# Patient Record
Sex: Female | Born: 1990 | Race: Black or African American | Hispanic: No | Marital: Single | Smoking: Never smoker
Health system: Southern US, Community
[De-identification: ages and names within clinical notes are randomized; demographics above are authoritative.]

## PROBLEM LIST (undated history)

## (undated) DIAGNOSIS — T4145XA Adverse effect of unspecified anesthetic, initial encounter: Secondary | ICD-10-CM

## (undated) DIAGNOSIS — T8859XA Other complications of anesthesia, initial encounter: Secondary | ICD-10-CM

---
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
## 2016-07-14 ENCOUNTER — Other Ambulatory Visit: Payer: Self-pay | Admitting: Podiatry

## 2016-07-15 NOTE — Patient Instructions (Addendum)
    Jennifer Fox  07/15/2016     @   Your procedure is scheduled on 07/23/2016.  Report to [facility] at 7:30 A.M.  Call this number if you have problems the morning of surgery:  [phone]   Remember:  Do not eat food or drink liquids after midnight.  Take these medicines the morning of surgery with A SIP OF WATER NONE   Do not wear jewelry, make-up or nail polish.  Do not wear lotions, powders, or perfumes, or deoderant.  Do not shave 48 hours prior to surgery.  Men may shave face and neck.  Do not bring valuables to the hospital.  [facility] is not responsible for any belongings or valuables.  Contacts, dentures or bridgework may not be worn into surgery.  Leave your suitcase in the car.  After surgery it may be brought to your room.  For patients admitted to the hospital, discharge time will be determined by your treatment team.  Patients discharged the day of surgery will not be allowed to drive home.    Please read over the following fact sheets that you were given. Surgical Site Infection Prevention and Anesthesia Post-op Instructions     PATIENT INSTRUCTIONS POST-ANESTHESIA  IMMEDIATELY FOLLOWING SURGERY:  Do not drive or operate machinery for the first twenty four hours after surgery.  Do not make any important decisions for twenty four hours after surgery or while taking narcotic pain medications or sedatives.  If you develop intractable nausea and vomiting or a severe headache please notify your doctor immediately.  FOLLOW-UP:  Please make an appointment with your surgeon as instructed. You do not need to follow up with anesthesia unless specifically instructed to do so.  WOUND CARE INSTRUCTIONS (if applicable):  Keep a dry clean dressing on the anesthesia/puncture wound site if there is drainage.  Once the wound has quit draining you may leave it open to air.  Generally you should leave the bandage intact for twenty four hours unless there is  drainage.  If the epidural site drains for more than 36-48 hours please call the anesthesia department.  QUESTIONS?:  Please feel free to call your physician or the hospital operator if you have any questions, and they will be happy to assist you.

## 2016-07-21 ENCOUNTER — Ambulatory Visit (HOSPITAL_COMMUNITY)
Admission: RE | Admit: 2016-07-21 | Discharge: 2016-07-21 | Disposition: A | Discharge status: Home / Self Care | Payer: BLUE CROSS/BLUE SHIELD | Source: Ambulatory Visit | Attending: Podiatry | Admitting: Podiatry

## 2016-07-21 ENCOUNTER — Encounter (HOSPITAL_COMMUNITY)
Admission: RE | Admit: 2016-07-21 | Discharge: 2016-07-21 | Disposition: A | Discharge status: Home / Self Care | Payer: BLUE CROSS/BLUE SHIELD | Source: Ambulatory Visit | Attending: Podiatry | Admitting: Podiatry

## 2016-07-21 ENCOUNTER — Other Ambulatory Visit (HOSPITAL_COMMUNITY): Payer: Self-pay | Admitting: Podiatry

## 2016-07-21 ENCOUNTER — Encounter (HOSPITAL_COMMUNITY): Payer: Self-pay

## 2016-07-21 DIAGNOSIS — M79671 Pain in right foot: Secondary | ICD-10-CM | POA: Insufficient documentation

## 2016-07-21 DIAGNOSIS — Z981 Arthrodesis status: Secondary | ICD-10-CM

## 2016-07-21 LAB — BASIC METABOLIC PANEL
ANION GAP: 9 (ref 5–15)
BUN: 12 mg/dL (ref 6–20)
CO2: 23 mmol/L (ref 22–32)
Calcium: 9.2 mg/dL (ref 8.9–10.3)
Chloride: 105 mmol/L (ref 101–111)
Creatinine, Ser: 0.86 mg/dL (ref 0.44–1.00)
GFR calc Af Amer: >60 mL/min (ref >60)
GFR calc non Af Amer: >60 mL/min (ref >60)
GLUCOSE: 71 mg/dL (ref 65–99)
POTASSIUM: 4.1 mmol/L (ref 3.5–5.1)
Sodium: 137 mmol/L (ref 135–145)

## 2016-07-21 LAB — CBC
HCT: 34.7 % — ABNORMAL LOW (ref 36.0–46.0)
Hemoglobin: 11.2 g/dL — ABNORMAL LOW (ref 12.0–15.0)
MCH: 26.9 pg (ref 26.0–34.0)
MCHC: 32.3 g/dL (ref 30.0–36.0)
MCV: 83.4 fL (ref 78.0–100.0)
Platelets: 239 10*3/uL (ref 150–400)
RBC: 4.16 MIL/uL (ref 3.87–5.11)
RDW: 13.4 % (ref 11.5–15.5)
WBC: 4.2 10*3/uL (ref 4.0–10.5)

## 2016-07-21 LAB — HCG, SERUM, QUALITATIVE: Preg, Serum: NEGATIVE

## 2016-07-23 ENCOUNTER — Encounter (HOSPITAL_COMMUNITY)
Admission: RE | Disposition: A | Discharge status: Home / Self Care | Payer: Self-pay | Source: Ambulatory Visit | Attending: Podiatry

## 2016-07-23 ENCOUNTER — Ambulatory Visit (HOSPITAL_COMMUNITY)
Admission: RE | Admit: 2016-07-23 | Discharge: 2016-07-23 | Disposition: A | Discharge status: Home / Self Care | Payer: BLUE CROSS/BLUE SHIELD | Source: Ambulatory Visit | Attending: Podiatry | Admitting: Podiatry

## 2016-07-23 ENCOUNTER — Other Ambulatory Visit: Payer: Self-pay | Admitting: Podiatry

## 2016-07-23 ENCOUNTER — Ambulatory Visit (HOSPITAL_COMMUNITY): Payer: BLUE CROSS/BLUE SHIELD

## 2016-07-23 ENCOUNTER — Encounter (HOSPITAL_COMMUNITY): Payer: Self-pay | Admitting: *Deleted

## 2016-07-23 ENCOUNTER — Ambulatory Visit (HOSPITAL_COMMUNITY): Payer: BLUE CROSS/BLUE SHIELD | Admitting: Anesthesiology

## 2016-07-23 DIAGNOSIS — Z9889 Other specified postprocedural states: Secondary | ICD-10-CM

## 2016-07-23 DIAGNOSIS — M2041 Other hammer toe(s) (acquired), right foot: Secondary | ICD-10-CM | POA: Insufficient documentation

## 2016-07-23 HISTORY — PX: ARTHRODESIS METATARSAL: SHX6565

## 2016-07-23 SURGERY — FUSION, JOINT, INVOLVING METATARSAL BONE
Anesthesia: Monitor Anesthesia Care | Laterality: Right

## 2016-07-23 MED ORDER — MIDAZOLAM HCL 2 MG/2ML IJ SOLN
1.0000 mg | INTRAMUSCULAR | Status: AC
  Administered 2016-07-23: 2 mg via INTRAVENOUS

## 2016-07-23 MED ORDER — MIDAZOLAM HCL 2 MG/2ML IJ SOLN
INTRAMUSCULAR | Status: AC
  Filled 2016-07-23: qty 2

## 2016-07-23 MED ORDER — LIDOCAINE HCL 1 % IJ SOLN
INTRAMUSCULAR | Status: DC | PRN
  Administered 2016-07-23: 7 mL via INTRAMUSCULAR

## 2016-07-23 MED ORDER — LIDOCAINE HCL (PF) 1 % IJ SOLN
INTRAMUSCULAR | Status: AC
  Filled 2016-07-23: qty 5

## 2016-07-23 MED ORDER — BUPIVACAINE HCL (PF) 0.5 % IJ SOLN
INTRAMUSCULAR | Status: AC
  Filled 2016-07-23: qty 30

## 2016-07-23 MED ORDER — PROPOFOL 10 MG/ML IV BOLUS
INTRAVENOUS | Status: AC
  Filled 2016-07-23: qty 40

## 2016-07-23 MED ORDER — SODIUM CHLORIDE 0.9 % IR SOLN
Status: DC | PRN
  Administered 2016-07-23: 1000 mL

## 2016-07-23 MED ORDER — FENTANYL CITRATE (PF) 100 MCG/2ML IJ SOLN
INTRAMUSCULAR | Status: DC | PRN
  Administered 2016-07-23: 25 ug via INTRAVENOUS
  Administered 2016-07-23: 50 ug via INTRAVENOUS
  Administered 2016-07-23: 25 ug via INTRAVENOUS

## 2016-07-23 MED ORDER — FENTANYL CITRATE (PF) 100 MCG/2ML IJ SOLN
INTRAMUSCULAR | Status: AC
  Filled 2016-07-23: qty 2

## 2016-07-23 MED ORDER — CEFAZOLIN SODIUM-DEXTROSE 2-4 GM/100ML-% IV SOLN
INTRAVENOUS | Status: AC
  Filled 2016-07-23: qty 100

## 2016-07-23 MED ORDER — CEFAZOLIN SODIUM-DEXTROSE 2-3 GM-% IV SOLR
INTRAVENOUS | Status: DC | PRN
  Administered 2016-07-23: 2 g via INTRAVENOUS

## 2016-07-23 MED ORDER — FENTANYL CITRATE (PF) 100 MCG/2ML IJ SOLN: 25.0000 ug | INTRAMUSCULAR | Status: DC | PRN

## 2016-07-23 MED ORDER — PROPOFOL 500 MG/50ML IV EMUL
INTRAVENOUS | Status: DC | PRN
  Administered 2016-07-23: 100 ug/kg/min via INTRAVENOUS

## 2016-07-23 MED ORDER — LIDOCAINE HCL (PF) 1 % IJ SOLN
INTRAMUSCULAR | Status: AC
  Filled 2016-07-23: qty 30

## 2016-07-23 MED ORDER — LACTATED RINGERS IV SOLN
INTRAVENOUS | Status: DC
  Administered 2016-07-23: 1000 mL via INTRAVENOUS

## 2016-07-23 MED ORDER — FENTANYL CITRATE (PF) 100 MCG/2ML IJ SOLN
25.0000 ug | Freq: Once | INTRAMUSCULAR | Status: AC
  Administered 2016-07-23: 25 ug via INTRAVENOUS

## 2016-07-23 MED ORDER — PROPOFOL 10 MG/ML IV BOLUS
INTRAVENOUS | Status: DC | PRN
Start: 2016-07-23 — End: 2016-07-23
  Administered 2016-07-23 (×4): 10 mg via INTRAVENOUS

## 2016-07-23 MED ORDER — ONDANSETRON HCL 4 MG/2ML IJ SOLN
4.0000 mg | Freq: Once | INTRAMUSCULAR | Status: AC
  Administered 2016-07-23: 4 mg via INTRAVENOUS

## 2016-07-23 MED ORDER — ONDANSETRON HCL 4 MG/2ML IJ SOLN
INTRAMUSCULAR | Status: AC
  Filled 2016-07-23: qty 2

## 2016-07-23 SURGICAL SUPPLY — 43 items
BAG HAMPER (MISCELLANEOUS) ×3 IMPLANT
BANDAGE ACE 4X5 VEL STRL LF (GAUZE/BANDAGES/DRESSINGS) ×3 IMPLANT
BANDAGE ELASTIC 4 LF NS (GAUZE/BANDAGES/DRESSINGS) ×3 IMPLANT
BANDAGE ESMARK 4X12 BL STRL LF (DISPOSABLE) ×1 IMPLANT
BENZOIN TINCTURE PRP APPL 2/3 (GAUZE/BANDAGES/DRESSINGS) ×3 IMPLANT
BLADE AVERAGE 25MMX9MM (BLADE) ×1
BLADE AVERAGE 25X9 (BLADE) ×2 IMPLANT
BLADE SURG 15 STRL LF DISP TIS (BLADE) ×2 IMPLANT
BLADE SURG 15 STRL SS (BLADE) ×4
BNDG CONFORM 2 STRL LF (GAUZE/BANDAGES/DRESSINGS) ×3 IMPLANT
BNDG ESMARK 4X12 BLUE STRL LF (DISPOSABLE) ×3
BNDG GAUZE ELAST 4 BULKY (GAUZE/BANDAGES/DRESSINGS) ×3 IMPLANT
BOOT STEPPER DURA LG (SOFTGOODS) IMPLANT
BOOT STEPPER DURA MED (SOFTGOODS) ×3 IMPLANT
BOOT STEPPER DURA SM (SOFTGOODS) IMPLANT
BOOT STEPPER DURA XLG (SOFTGOODS) IMPLANT
CHLORAPREP W/TINT 26ML (MISCELLANEOUS) ×3 IMPLANT
CLOSURE WOUND 1/2 X4 (GAUZE/BANDAGES/DRESSINGS) ×1
CLOTH BEACON ORANGE TIMEOUT ST (SAFETY) ×3 IMPLANT
COVER LIGHT HANDLE STERIS (MISCELLANEOUS) ×6 IMPLANT
CUFF TOURNIQUET SINGLE 18IN (TOURNIQUET CUFF) ×3 IMPLANT
DECANTER SPIKE VIAL GLASS SM (MISCELLANEOUS) ×6 IMPLANT
DRAPE OEC MINIVIEW 54X84 (DRAPES) ×3 IMPLANT
DRSG ADAPTIC 3X8 NADH LF (GAUZE/BANDAGES/DRESSINGS) ×3 IMPLANT
ELECT REM PT RETURN 9FT ADLT (ELECTROSURGICAL) ×3
ELECTRODE REM PT RTRN 9FT ADLT (ELECTROSURGICAL) ×1 IMPLANT
GAUZE SPONGE 4X4 12PLY STRL (GAUZE/BANDAGES/DRESSINGS) ×3 IMPLANT
GLOVE BIO SURGEON STRL SZ7.5 (GLOVE) ×3 IMPLANT
GLOVE BIOGEL PI IND STRL 7.0 (GLOVE) ×2 IMPLANT
GLOVE BIOGEL PI INDICATOR 7.0 (GLOVE) ×4
GLOVE ECLIPSE 7.0 STRL STRAW (GLOVE) ×3 IMPLANT
GOWN STRL REUS W/TWL LRG LVL3 (GOWN DISPOSABLE) ×9 IMPLANT
KIT ROOM TURNOVER AP CYSTO (KITS) ×3 IMPLANT
MANIFOLD NEPTUNE II (INSTRUMENTS) ×3 IMPLANT
NEEDLE HYPO 22GX1.5 SAFETY (NEEDLE) ×6 IMPLANT
NS IRRIG 1000ML POUR BTL (IV SOLUTION) ×3 IMPLANT
PACK BASIC LIMB (CUSTOM PROCEDURE TRAY) ×3 IMPLANT
PAD ARMBOARD 7.5X6 YLW CONV (MISCELLANEOUS) ×3 IMPLANT
SET BASIN LINEN APH (SET/KITS/TRAYS/PACK) ×3 IMPLANT
STRIP CLOSURE SKIN 1/2X4 (GAUZE/BANDAGES/DRESSINGS) ×2 IMPLANT
SUT PROLENE 3 0 PS 2 (SUTURE) ×3 IMPLANT
SUT VIC AB 2-0 CT2 27 (SUTURE) ×3 IMPLANT
SYR CONTROL 10ML LL (SYRINGE) ×6 IMPLANT

## 2016-07-23 NOTE — Brief Op Note (Signed)
07/23/2016  10:18 AM  PATIENT:  Jennifer Fox  26 y.o. female  PRE-OPERATIVE DIAGNOSIS:  right painful hammer toe 4th digit, right painful hammer toe 2nd digit  POST-OPERATIVE DIAGNOSIS:  * No post-op diagnosis entered *  PROCEDURE:  Procedure(s): ARTHROPLASTY RIGHT 2ND AND 4TH TOE (Right)  SURGEON:  Surgeon(s) and Role:    * Tyson Babinski, DPM - Primary  PHYSICIAN ASSISTANT:   ASSISTANTS: none   ANESTHESIA:   local and MAC  EBL:  Total I/O In: 600 [I.V.:600] Out: 10 [Blood:10]  BLOOD ADMINISTERED:none  DRAINS: none   LOCAL MEDICATIONS USED:  MARCAINE   , LIDOCAINE  and Amount: 8 ml  SPECIMEN:  No Specimen  DISPOSITION OF SPECIMEN:  N/A  COUNTS:  YES  TOURNIQUET:   Total Tourniquet Time Documented: Leg (Right) - 39 minutes Total: Leg (Right) - 39 minutes   DICTATION: .Viviann Spare Dictation  PLAN OF CARE: Discharge to home after PACU  PATIENT DISPOSITION:  PACU - hemodynamically stable.   Delay start of Pharmacological VTE agent (>24hrs) due to surgical blood loss or risk of bleeding: not applicable

## 2016-07-23 NOTE — Anesthesia Preprocedure Evaluation (Signed)
Anesthesia Evaluation  Patient identified by MRN, date of birth, ID band Patient awake    Reviewed: Allergy & Precautions, NPO status , Patient's Chart, lab work & pertinent test results  Airway Mallampati: II  TM Distance: >3 FB     Dental  (+) Teeth Intact   Pulmonary neg pulmonary ROS,    breath sounds clear to auscultation       Cardiovascular negative cardio ROS   Rhythm:Regular Rate:Normal     Neuro/Psych    GI/Hepatic negative GI ROS,   Endo/Other    Renal/GU      Musculoskeletal   Abdominal   Peds  Hematology   Anesthesia Other Findings   Reproductive/Obstetrics                             Anesthesia Physical Anesthesia Plan  ASA: II  Anesthesia Plan: MAC   Post-op Pain Management:    Induction: Intravenous  Airway Management Planned: Simple Face Mask  Additional Equipment:   Intra-op Plan:   Post-operative Plan:   Informed Consent: I have reviewed the patients History and Physical, chart, labs and discussed the procedure including the risks, benefits and alternatives for the proposed anesthesia with the patient or authorized representative who has indicated his/her understanding and acceptance.     Plan Discussed with:   Anesthesia Plan Comments:         Anesthesia Quick Evaluation

## 2016-07-23 NOTE — Transfer of Care (Signed)
Immediate Anesthesia Transfer of Care Note  Patient: Jennifer Fox  Procedure(s) Performed: Procedure(s): ARTHROPLASTY RIGHT 2ND AND 4TH TOE (Right)  Patient Location: PACU  Anesthesia Type:MAC  Level of Consciousness: awake and alert   Airway & Oxygen Therapy: Patient Spontanous Breathing and Patient connected to nasal cannula oxygen  Post-op Assessment: Report given to RN  Post vital signs: Reviewed and stable  Last Vitals:  Vitals:   07/23/16 0900 07/23/16 0905  BP: 113/69 (!) 112/59  Pulse:    Resp: 20 11  Temp:      Last Pain:  Vitals:   07/23/16 0803  TempSrc: Oral      Patients Stated Pain Goal: 7 (75/64/33 2951)  Complications: No apparent anesthesia complications

## 2016-07-23 NOTE — Op Note (Signed)
07/23/2016  10:18 AM  PATIENT:  Jennifer Fox  26 y.o. female  PRE-OPERATIVE DIAGNOSIS:  right painful hammer toe 4th digit, right painful hammer toe 2nd digit  POST-OPERATIVE DIAGNOSIS:  * No post-op diagnosis entered *  PROCEDURE:  Procedure(s): ARTHROPLASTY RIGHT 2ND AND 4TH TOE (Right)  SURGEON:  Surgeon(s) and Role:    * Tyson Babinski, DPM - Primary  PHYSICIAN ASSISTANT:   ASSISTANTS: none   ANESTHESIA:   local and MAC  EBL:  Total I/O In: 600 [I.V.:600] Out: 10 [Blood:10]  BLOOD ADMINISTERED:none  DRAINS: none   LOCAL MEDICATIONS USED:  MARCAINE   , LIDOCAINE  and Amount: 8 ml  SPECIMEN:  No Specimen  DISPOSITION OF SPECIMEN:  N/A  COUNTS:  YES  TOURNIQUET:   Total Tourniquet Time Documented: Leg (Right) - 39 minutes Total: Leg (Right) - 39 minutes   DICTATION: .Viviann Spare Dictation  PLAN OF CARE: Discharge to home after PACU  PATIENT DISPOSITION:  PACU - hemodynamically stable.   Delay start of Pharmacological VTE agent (>24hrs) due to surgical blood loss or risk of bleeding: not applicable 6/[id number]/8546  27:03 AM  PATIENT:  Jennifer Fox  26 y.o. female  PRE-OPERATIVE DIAGNOSIS:  right painful hammer toe 4th digit, right painful hammer toe 2nd digit  POST-OPERATIVE DIAGNOSIS:  * No post-op diagnosis entered *  PROCEDURE:  Procedure(s): ARTHROPLASTY RIGHT 2ND AND 4TH TOE (Right)  SURGEON:  Surgeon(s) and Role:    * Tyson Babinski, DPM - Primary  PHYSICIAN ASSISTANT:   ASSISTANTS: none   ANESTHESIA:   local and MAC  EBL:  Total I/O In: 600 [I.V.:600] Out: 10 [Blood:10]  BLOOD ADMINISTERED:none  DRAINS: none   LOCAL MEDICATIONS USED:  MARCAINE   , LIDOCAINE  and Amount: 8 ml  SPECIMEN:  No Specimen  DISPOSITION OF SPECIMEN:  N/A  COUNTS:  YES  TOURNIQUET:   Total Tourniquet Time Documented: Leg (Right) - 39 minutes Total: Leg (Right) - 39 minutes   DICTATION: .Viviann Spare  Dictation  PLAN OF CARE: Discharge to home after PACU  PATIENT DISPOSITION:  PACU - hemodynamically stable.   Delay start of Pharmacological VTE agent (>24hrs) due to surgical blood loss or risk of bleeding: not applicable  Patient was brought into the operating room laid supine on the operating table. Ankle tourniquet was applied to the surgical extremity. Following IV sedation, a local block was achieved using 8 cc of mixture of 1% plain lidocaine with 0.5% marcaine. The foot was the prepped, scrubbed and draped in aseptic manner. Using an esmarch band the tourniquet on the surgical site was inflatted at 249mHG.   Attention was directed over the right second proximal interphalangeal joint. Semi elliptical incision was marked over the hyperkeratotic lesion. Using skin blade incision was made and skin was removed. Care was made to retract all neurovascular bundle. At this time using deep blade, transverse tenotomy of the extenson tendon was performed. The head of the proximal phalanx was freed and using a saw, the head of proximal phalanx was removed. Rasp was used to rasp down any sharp edges. Fluroscopy was used at this time to visualize the cut.   Attention was directed over the right fourth proximal interphalangeal joint. Semi elliptical incision was marked over the hyperkeratotic lesion. Using skin blade incision was made and skin was removed. Care was made to retract all neurovascular bundle. At this time using deep blade, transverse tenotomy of the extenson tendon was performed. The head of the proximal phalanx was freed and using  a saw, the head of proximal phalanx was removed. Rasp was used to rasp down any sharp edges. Fluroscopy was used at this time to visualize the cut.   Both Surgical sites were flushed with normal saline. Extensor tendon was sutured using 2-0 vicryl. The skin was closed using 3-0 prolene. Betadine dressing was applied. Steristrips were applied. Dry sterile dressing  was applied. Tourniquet was deflated. Capillary refill time to all lesser toes were immediate. Patient was transferred to PACU with Vitals signs stable.

## 2016-07-23 NOTE — H&P (Signed)
.   HISTORY AND PHYSICAL INTERVAL NOTE:  07/23/2016  9:00 AM  Jennifer Fox  has presented today for surgery, with the diagnosis of right painful hammer toe 4th digit, right painful hammer toe 2nd digit.  The various methods of treatment have been discussed with the patient.  No guarantees were given.  After consideration of risks, benefits and other options for treatment, the patient has consented to surgery.  I have reviewed the patients' chart and labs.    Patient Vitals for the past 24 hrs:  BP Temp Temp src Pulse Resp SpO2 Height Weight  07/23/16 0855 102/64 - - - 16 100 % - -  07/23/16 0850 110/72 - - - 19 99 % - -  07/23/16 0845 111/73 - - - 15 98 % - -  07/23/16 0840 110/66 - - - 11 99 % - -  07/23/16 0830 105/66 - - - (!) 22 100 % - -  07/23/16 0825 113/74 - - - 15 100 % - -  07/23/16 0820 - - - - (!) 25 94 % - -  07/23/16 0815 - - - - 18 92 % - -  07/23/16 0810 - - - - 19 92 % - -  07/23/16 0805 114/68 - - - 19 96 % - -  07/23/16 0803 114/68 98.3 F (36.8 C) Oral 67 18 94 %  (1.651 m) 59 kg (130 lb)  07/23/16 0800 - - - - (!) 23 - - -    A history and physical examination was performed in my office.  The patient was reexamined.  There have been no changes to this history and physical examination.  Jennifer Fox DPM

## 2016-07-23 NOTE — Anesthesia Postprocedure Evaluation (Signed)
Anesthesia Post Note  Patient: Jennifer Fox  Procedure(s) Performed: Procedure(s) (LRB): ARTHROPLASTY RIGHT 2ND AND 4TH TOE (Right)  Patient location during evaluation: PACU Anesthesia Type: MAC Level of consciousness: awake and alert and oriented Respiratory status: spontaneous breathing Cardiovascular status: blood pressure returned to baseline Postop Assessment: no signs of nausea or vomiting Anesthetic complications: no     Last Vitals:  Vitals:   07/23/16 1030 07/23/16 1045  BP: 108/71 113/69  Pulse: (!) 46 (!) 50  Resp: 10 12  Temp:      Last Pain:  Vitals:   07/23/16 0803  TempSrc: Oral                 Octavius Shin

## 2016-07-23 NOTE — Discharge Instructions (Signed)

## 2016-07-24 ENCOUNTER — Encounter (HOSPITAL_COMMUNITY): Payer: Self-pay | Admitting: Podiatry

## 2018-12-15 ENCOUNTER — Other Ambulatory Visit: Payer: Self-pay | Admitting: Podiatry

## 2018-12-28 NOTE — Patient Instructions (Signed)
Jennifer Fox  12/28/2018     @PREFPERIOPPHARMACY @   Your procedure is scheduled on  01/05/2019.  Report to [facility] at  700   A.M.  Call this number if you have problems the morning of surgery:  [phone]   Remember:  Do not eat or drink after midnight.                        Take these medicines the morning of surgery with A SIP OF WATER None    Do not wear jewelry, make-up or nail polish.  Do not wear lotions, powders, or perfumes. Please wear deodorant and brush your teeth.  Do not shave 48 hours prior to surgery.  Men may shave face and neck.  Do not bring valuables to the hospital.  [facility] is not responsible for any belongings or valuables.  Contacts, dentures or bridgework may not be worn into surgery.  Leave your suitcase in the car.  After surgery it may be brought to your room.  For patients admitted to the hospital, discharge time will be determined by your treatment team.  Patients discharged the day of surgery will not be allowed to drive home.   Name and phone number of your driver:   family Special instructions:  None  Please read over the following fact sheets that you were given. Anesthesia Post-op Instructions and Care and Recovery After Surgery       Toe Deformity Repair, Care After This sheet gives you information about how to care for yourself after your procedure. Your health care provider may also give you more specific instructions. If you have problems or questions, contact your health care provider. What can I expect after the procedure? After the procedure, it is common to have:  Pain in the affected area.  Discomfort with walking. Follow these instructions at home: If you have a post-operative shoe:   Wear the shoe as told by your health care provider. Remove it only as told by your health care provider.  Loosen the shoe if your toes tingle, become numb, or turn cold and blue.  Keep the shoe clean and dry.  Bathing  Do not take baths, swim, or use a hot tub until your health care provider approves. Ask your health care provider if you can take showers. You may only be allowed to take sponge baths.  If your post-operative shoe is not waterproof, cover it with a watertight covering when you take a bath or a shower.  Keep the bandage (dressing) dry until your health care provider says it can be removed. Incision care   Follow instructions from your health care provider about how to take care of your incision. Make sure you: ? Wash your hands with soap and water before you change your dressing. If soap and water are not available, use hand sanitizer. ? Change your dressing as told by your health care provider. ? Leave stitches (sutures), skin glue, or adhesive strips in place. These skin closures may need to stay in place for 2 weeks or longer.  Check your incision area every day for signs of infection. Check for: ? Redness, swelling, or pain. ? Fluid or blood. ? Warmth. ? Pus or a bad smell. Managing pain, stiffness, and swelling   If directed, put ice on the affected area. ? Put ice in a plastic bag. ? Place a towel between your skin and the bag. ?  Leave the ice on for 20 minutes, 2-3 times a day.  Raise (elevate) the affected foot above the level of your heart while you are sitting or lying down. Driving  Do not drive or use heavy machinery while taking prescription pain medicine.  Ask your health care provider when it is safe to drive if you have a post-operative shoe on your foot. Activity  Walk and return to your normal activities as told by your health care provider. Ask your health care provider what activities are safe for you.  Do not use your affected foot to support your body weight until your health care provider says that you can. Use crutches as directed by your health care provider.  Do exercises as told by your health care provider or physical therapist. General  instructions  Take over-the-counter and prescription medicines only as told by your health care provider.  If you are taking prescription pain medicine, take actions to prevent or treat constipation. Your health care provider may recommend that you: ? Drink enough fluid to keep your urine pale yellow. ? Eat foods that are high in fiber, such as fresh fruits and vegetables, whole grains, and beans. ? Limit foods that are high in fat and processed sugars, such as fried or sweet foods. ? Take an over-the-counter or prescription medicine for constipation.  Do not use any products that contain nicotine or tobacco, such as cigarettes and e-cigarettes. These can delay bone healing. If you need help quitting, ask your health care provider.  Keep all follow-up visits as told by your health care provider. This is important. Contact a health care provider if:  You have redness, swelling, or pain at your incision site.  You have fluid or blood coming from your incision.  Your incision feels warm to the touch.  You have pus or a bad smell coming from the incision area or the dressing.  You have a fever. Get help right away if:  You develop a rash.  You have difficulty breathing. Summary  After the procedure, it is common to have pain in the affected area and discomfort with walking.  Follow instructions from your health care provider about how to take care of your incision.  Walk and return to your normal activities as told by your health care provider. Ask your health care provider what activities are safe for you.  Keep all follow-up visits as told by your health care provider. This information is not intended to replace advice given to you by your health care provider. Make sure you discuss any questions you have with your health care provider. Document Released: 10/11/2004 Document Revised: 07/14/2018 Document Reviewed: 12/02/2016 Elsevier Patient Education  2020 [employer]   Monitored Anesthesia Care, Care After These instructions provide you with information about caring for yourself after your procedure. Your health care provider may also give you more specific instructions. Your treatment has been planned according to current medical practices, but problems sometimes occur. Call your health care provider if you have any problems or questions after your procedure. What can I expect after the procedure? After your procedure, you may:  Feel sleepy for several hours.  Feel clumsy and have poor balance for several hours.  Feel forgetful about what happened after the procedure.  Have poor judgment for several hours.  Feel nauseous or vomit.  Have a sore throat if you had a breathing tube during the procedure. Follow these instructions at home: For at least 24 hours after the procedure:  Have a responsible adult stay with you. It is important to have someone help care for you until you are awake and alert.  Rest as needed.  Do not: ? Participate in activities in which you could fall or become injured. ? Drive. ? Use heavy machinery. ? Drink alcohol. ? Take sleeping pills or medicines that cause drowsiness. ? Make important decisions or sign legal documents. ? Take care of children on your own. Eating and drinking  Follow the diet that is recommended by your health care provider.  If you vomit, drink water, juice, or soup when you can drink without vomiting.  Make sure you have little or no nausea before eating solid foods. General instructions  Take over-the-counter and prescription medicines only as told by your health care provider.  If you have sleep apnea, surgery and certain medicines can increase your risk for breathing problems. Follow instructions from your health care provider about wearing your sleep device: ? Anytime you are sleeping, including during daytime naps. ? While taking prescription pain medicines, sleeping medicines,  or medicines that make you drowsy.  If you smoke, do not smoke without supervision.  Keep all follow-up visits as told by your health care provider. This is important. Contact a health care provider if:  You keep feeling nauseous or you keep vomiting.  You feel light-headed.  You develop a rash.  You have a fever. Get help right away if:  You have trouble breathing. Summary  For several hours after your procedure, you may feel sleepy and have poor judgment.  Have a responsible adult stay with you for at least 24 hours or until you are awake and alert. This information is not intended to replace advice given to you by your health care provider. Make sure you discuss any questions you have with your health care provider. Document Released: 07/15/2015 Document Revised: 06/22/2017 Document Reviewed: 07/15/2015 Elsevier Patient Education  2020 [employer]. How to Use Chlorhexidine for Bathing Chlorhexidine gluconate (CHG) is a germ-killing (antiseptic) solution that is used to clean the skin. It can get rid of the bacteria that normally live on the skin and can keep them away for about 24 hours. To clean your skin with CHG, you may be given:  A CHG solution to use in the shower or as part of a sponge bath.  A prepackaged cloth that contains CHG. Cleaning your skin with CHG may help lower the risk for infection:  While you are staying in the intensive care unit of the hospital.  If you have a vascular access, such as a central line, to provide short-term or long-term access to your veins.  If you have a catheter to drain urine from your bladder.  If you are on a ventilator. A ventilator is a machine that helps you breathe by moving air in and out of your lungs.  After surgery. What are the risks? Risks of using CHG include:  A skin reaction.  Hearing loss, if CHG gets in your ears.  Eye injury, if CHG gets in your eyes and is not rinsed out.  The CHG product catching  fire. Make sure that you avoid smoking and flames after applying CHG to your skin. Do not use CHG:  If you have a chlorhexidine allergy or have previously reacted to chlorhexidine.  On babies younger than 85 months of age. How to use CHG solution  Use CHG only as told by your health care provider, and follow the instructions on the label.  Use the full amount of CHG as directed. Usually, this is one bottle. During a shower Follow these steps when using CHG solution during a shower (unless your health care provider gives you different instructions): 1. Start the shower. 2. Use your normal soap and shampoo to wash your face and hair. 3. Turn off the shower or move out of the shower stream. 4. Pour the CHG onto a clean washcloth. Do not use any type of brush or rough-edged sponge. 5. Starting at your neck, lather your body down to your toes. Make sure you follow these instructions: ? If you will be having surgery, pay special attention to the part of your body where you will be having surgery. Scrub this area for at least 1 minute. ? Do not use CHG on your head or face. If the solution gets into your ears or eyes, rinse them well with water. ? Avoid your genital area. ? Avoid any areas of skin that have broken skin, cuts, or scrapes. ? Scrub your back and under your arms. Make sure to wash skin folds. 6. Let the lather sit on your skin for 1-2 minutes or as long as told by your health care provider. 7. Thoroughly rinse your entire body in the shower. Make sure that all body creases and crevices are rinsed well. 8. Dry off with a clean towel. Do not put any substances on your body afterward-such as powder, lotion, or perfume-unless you are told to do so by your health care provider. Only use lotions that are recommended by the manufacturer. 9. Put on clean clothes or pajamas. 10. If it is the night before your surgery, sleep in clean sheets.  During a sponge bath Follow these steps when  using CHG solution during a sponge bath (unless your health care provider gives you different instructions): 1. Use your normal soap and shampoo to wash your face and hair. 2. Pour the CHG onto a clean washcloth. 3. Starting at your neck, lather your body down to your toes. Make sure you follow these instructions: ? If you will be having surgery, pay special attention to the part of your body where you will be having surgery. Scrub this area for at least 1 minute. ? Do not use CHG on your head or face. If the solution gets into your ears or eyes, rinse them well with water. ? Avoid your genital area. ? Avoid any areas of skin that have broken skin, cuts, or scrapes. ? Scrub your back and under your arms. Make sure to wash skin folds. 4. Let the lather sit on your skin for 1-2 minutes or as long as told by your health care provider. 5. Using a different clean, wet washcloth, thoroughly rinse your entire body. Make sure that all body creases and crevices are rinsed well. 6. Dry off with a clean towel. Do not put any substances on your body afterward-such as powder, lotion, or perfume-unless you are told to do so by your health care provider. Only use lotions that are recommended by the manufacturer. 7. Put on clean clothes or pajamas. 8. If it is the night before your surgery, sleep in clean sheets. How to use CHG prepackaged cloths  Only use CHG cloths as told by your health care provider, and follow the instructions on the label.  Use the CHG cloth on clean, dry skin.  Do not use the CHG cloth on your head or face unless your health care provider tells you to.  When  washing with the CHG cloth: ? Avoid your genital area. ? Avoid any areas of skin that have broken skin, cuts, or scrapes. Before surgery Follow these steps when using a CHG cloth to clean before surgery (unless your health care provider gives you different instructions): 1. Using the CHG cloth, vigorously scrub the part of your  body where you will be having surgery. Scrub using a back-and-forth motion for 3 minutes. The area on your body should be completely wet with CHG when you are done scrubbing. 2. Do not rinse. Discard the cloth and let the area air-dry. Do not put any substances on the area afterward, such as powder, lotion, or perfume. 3. Put on clean clothes or pajamas. 4. If it is the night before your surgery, sleep in clean sheets.  For general bathing Follow these steps when using CHG cloths for general bathing (unless your health care provider gives you different instructions). 1. Use a separate CHG cloth for each area of your body. Make sure you wash between any folds of skin and between your fingers and toes. Wash your body in the following order, switching to a new cloth after each step: ? The front of your neck, shoulders, and chest. ? Both of your arms, under your arms, and your hands. ? Your stomach and groin area, avoiding the genitals. ? Your right leg and foot. ? Your left leg and foot. ? The back of your neck, your back, and your buttocks. 2. Do not rinse. Discard the cloth and let the area air-dry. Do not put any substances on your body afterward-such as powder, lotion, or perfume-unless you are told to do so by your health care provider. Only use lotions that are recommended by the manufacturer. 3. Put on clean clothes or pajamas. Contact a health care provider if:  Your skin gets irritated after scrubbing.  You have questions about using your solution or cloth. Get help right away if:  Your eyes become very red or swollen.  Your eyes itch badly.  Your skin itches badly and is red or swollen.  Your hearing changes.  You have trouble seeing.  You have swelling or tingling in your mouth or throat.  You have trouble breathing.  You swallow any chlorhexidine. Summary  Chlorhexidine gluconate (CHG) is a germ-killing (antiseptic) solution that is used to clean the skin. Cleaning  your skin with CHG may help to lower your risk for infection.  You may be given CHG to use for bathing. It may be in a bottle or in a prepackaged cloth to use on your skin. Carefully follow your health care provider's instructions and the instructions on the product label.  Do not use CHG if you have a chlorhexidine allergy.  Contact your health care provider if your skin gets irritated after scrubbing. This information is not intended to replace advice given to you by your health care provider. Make sure you discuss any questions you have with your health care provider. Document Released: 12/17/2011 Document Revised: 06/10/2018 Document Reviewed: 02/19/2017 Elsevier Patient Education  2020 [employer].

## 2019-01-03 ENCOUNTER — Encounter (HOSPITAL_COMMUNITY)
Admission: RE | Admit: 2019-01-03 | Discharge: 2019-01-03 | Disposition: A | Discharge status: Home / Self Care | Payer: BLUE CROSS/BLUE SHIELD | Source: Ambulatory Visit | Attending: Podiatry | Admitting: Podiatry

## 2019-01-03 ENCOUNTER — Other Ambulatory Visit (HOSPITAL_COMMUNITY): Payer: BLUE CROSS/BLUE SHIELD

## 2019-01-03 ENCOUNTER — Other Ambulatory Visit (HOSPITAL_COMMUNITY)
Admission: RE | Admit: 2019-01-03 | Discharge: 2019-01-03 | Disposition: A | Discharge status: Home / Self Care | Payer: BLUE CROSS/BLUE SHIELD | Source: Ambulatory Visit | Attending: Podiatry | Admitting: Podiatry

## 2019-01-04 ENCOUNTER — Encounter (HOSPITAL_COMMUNITY): Admission: RE | Admit: 2019-01-04 | Payer: Medicaid Other | Source: Ambulatory Visit

## 2019-01-04 ENCOUNTER — Other Ambulatory Visit (HOSPITAL_COMMUNITY)
Admission: RE | Admit: 2019-01-04 | Discharge: 2019-01-04 | Disposition: A | Discharge status: Home / Self Care | Payer: Medicaid Other | Source: Ambulatory Visit | Attending: Podiatry | Admitting: Podiatry

## 2019-01-04 ENCOUNTER — Encounter (HOSPITAL_COMMUNITY): Payer: Self-pay

## 2019-01-12 ENCOUNTER — Other Ambulatory Visit: Payer: Self-pay | Admitting: Podiatry

## 2019-01-13 NOTE — Patient Instructions (Signed)
Jennifer Fox  01/13/2019     @   Your procedure is scheduled on  01/19/2019.  Report to [facility] at  0940 A.M.  Call this number if you have problems the morning of surgery:  [phone]   Remember:  Do not eat or drink after midnight.                        Take these medicines the morning of surgery with A SIP OF WATER None    Do not wear jewelry, make-up or nail polish.  Do not wear lotions, powders, or perfumes. Please wear deodorant and brush your teeth.  Do not shave 48 hours prior to surgery.  Men may shave face and neck.  Do not bring valuables to the hospital.  [facility] is not responsible for any belongings or valuables.  Contacts, dentures or bridgework may not be worn into surgery.  Leave your suitcase in the car.  After surgery it may be brought to your room.  For patients admitted to the hospital, discharge time will be determined by your treatment team.  Patients discharged the day of surgery will not be allowed to drive home.   Name and phone number of your driver:   family Special instructions:  none  Please read over the following fact sheets that you were given. Anesthesia Post-op Instructions and Care and Recovery After Surgery       Metatarsal Osteotomy, Care After This sheet gives you information about how to care for yourself after your procedure. Your health care provider may also give you more specific instructions. If you have problems or questions, contact your health care provider. What can I expect after the procedure? After the procedure, it is common to have:  Soreness.  Pain.  Stiffness.  Swelling. Follow these instructions at home: Medicines  Take over-the-counter and prescription medicines only as told by your health care provider.  Ask your health care provider if the medicine prescribed to you can cause constipation. You may need to take steps to prevent or treat constipation, such as: ?  Drink enough fluid to keep your urine pale yellow. ? Take over-the-counter or prescription medicines. ? Eat foods that are high in fiber, such as beans, whole grains, and fresh fruits and vegetables. ? Limit foods that are high in fat and processed sugars, such as fried or sweet foods. If you have a splint or walking boot:  Wear it as told by your health care provider. Remove it only as told by your health care provider.  Loosen it if your toes tingle, become numb, or turn cold and blue.  Keep it clean.  If it is not waterproof: ? Do not let it get wet. ? Cover it with a watertight covering when you take a bath or shower. Bathing  Do not take baths, swim, or use a hot tub until your health care provider approves. Ask your health care provider if you may take showers. You may only be allowed to take sponge baths.  Keep the bandage (dressing) dry. Incision care   Follow instructions from your health care provider about how to take care of your incision. Make sure you: ? Wash your hands with soap and water before you change your bandage (dressing). If soap and water are not available, use hand sanitizer. ? Change your dressing as told by your health care provider. ? Leave stitches (sutures), skin glue, or adhesive  strips in place. These skin closures may need to stay in place for 2 weeks or longer. If adhesive strip edges start to loosen and curl up, you may trim the loose edges. Do not remove adhesive strips completely unless your health care provider tells you to do that.  Check your incision area every day for signs of infection. Check for: ? More redness, swelling, or pain. ? Fluid or blood. ? Warmth. ? Pus or a bad smell. Managing pain, stiffness, and swelling   If directed, put ice on the injured area. ? If you have a removable splint, remove it as told by your health care provider. ? Put ice in a plastic bag. ? Place a towel between your skin and the bag. ? Leave the ice  on for 20 minutes, 2-3 times a day.  Move your toes often to avoid stiffness and to lessen swelling.  Raise (elevate) the injured area above the level of your heart while you are sitting or lying down. Driving  Do not drive or use heavy machinery while taking prescription pain medicine.  Do not drive for 24 hours if you were given a sedative during your procedure.  Ask your health care provider when it is safe to drive if you have a dressing, splint, special shoe, or walking boot on your foot. General instructions  Do not remove the bandage (dressing) around your foot until directed by your health care provider.  If you were given a splint, special shoe, or walking boot, wear it as told by your health care provider.  Do not use the injured limb to support your body weight until your health care provider says that you can. Use crutches or a walker as told by your health care provider.  Return to your normal activities as told by your health care provider. Ask your health care provider what activities are safe for you.  Do not use any products that contain nicotine or tobacco, such as cigarettes, e-cigarettes, and chewing tobacco. These can delay bone healing. If you need help quitting, ask your health care provider.  Keep all follow-up visits as told by your health care provider. This is important. Contact a health care provider if:  You have a fever.  Your dressing becomes wet, loose, or stained with blood or discharge.  You have pus or a bad smell coming from your incision or bandage.  Your foot becomes red, swollen, or tender.  You have pain or stiffness that does not get better or gets worse.  You have tingling or numbness in your foot that does not get better or gets worse. Get help right away if:  You develop a warm and tender swelling in your leg.  You have chest pain.  You have trouble breathing. Summary  After the procedure, it is common to have soreness, pain,  stiffness, and swelling.  Follow instructions on caring for your incision, changing your dressing, and using weight support to protect your foot.  Contact your health care provider if you have a fever, pus or a bad smell coming from your wound or dressing, or pain and stiffness do not get better.  Get help right away if you develop a warm and tender swelling in your leg, have chest pain, or trouble breathing. This information is not intended to replace advice given to you by your health care provider. Make sure you discuss any questions you have with your health care provider. Document Released: 03/05/2015 Document Revised: 02/05/2018 Document Reviewed: 02/05/2018  Elsevier Patient Education  [employer]. How to Use Chlorhexidine for Bathing Chlorhexidine gluconate (CHG) is a germ-killing (antiseptic) solution that is used to clean the skin. It can get rid of the bacteria that normally live on the skin and can keep them away for about 24 hours. To clean your skin with CHG, you may be given:  A CHG solution to use in the shower or as part of a sponge bath.  A prepackaged cloth that contains CHG. Cleaning your skin with CHG may help lower the risk for infection:  While you are staying in the intensive care unit of the hospital.  If you have a vascular access, such as a central line, to provide short-term or long-term access to your veins.  If you have a catheter to drain urine from your bladder.  If you are on a ventilator. A ventilator is a machine that helps you breathe by moving air in and out of your lungs.  After surgery. What are the risks? Risks of using CHG include:  A skin reaction.  Hearing loss, if CHG gets in your ears.  Eye injury, if CHG gets in your eyes and is not rinsed out.  The CHG product catching fire. Make sure that you avoid smoking and flames after applying CHG to your skin. Do not use CHG:  If you have a chlorhexidine allergy or have previously  reacted to chlorhexidine.  On babies younger than 87 months of age. How to use CHG solution  Use CHG only as told by your health care provider, and follow the instructions on the label.  Use the full amount of CHG as directed. Usually, this is one bottle. During a shower Follow these steps when using CHG solution during a shower (unless your health care provider gives you different instructions): 1. Start the shower. 2. Use your normal soap and shampoo to wash your face and hair. 3. Turn off the shower or move out of the shower stream. 4. Pour the CHG onto a clean washcloth. Do not use any type of brush or rough-edged sponge. 5. Starting at your neck, lather your body down to your toes. Make sure you follow these instructions: ? If you will be having surgery, pay special attention to the part of your body where you will be having surgery. Scrub this area for at least 1 minute. ? Do not use CHG on your head or face. If the solution gets into your ears or eyes, rinse them well with water. ? Avoid your genital area. ? Avoid any areas of skin that have broken skin, cuts, or scrapes. ? Scrub your back and under your arms. Make sure to wash skin folds. 6. Let the lather sit on your skin for 1-2 minutes or as long as told by your health care provider. 7. Thoroughly rinse your entire body in the shower. Make sure that all body creases and crevices are rinsed well. 8. Dry off with a clean towel. Do not put any substances on your body afterward-such as powder, lotion, or perfume-unless you are told to do so by your health care provider. Only use lotions that are recommended by the manufacturer. 9. Put on clean clothes or pajamas. 10. If it is the night before your surgery, sleep in clean sheets.  During a sponge bath Follow these steps when using CHG solution during a sponge bath (unless your health care provider gives you different instructions): 1. Use your normal soap and shampoo to wash your  face and hair. 2. Pour the CHG onto a clean washcloth. 3. Starting at your neck, lather your body down to your toes. Make sure you follow these instructions: ? If you will be having surgery, pay special attention to the part of your body where you will be having surgery. Scrub this area for at least 1 minute. ? Do not use CHG on your head or face. If the solution gets into your ears or eyes, rinse them well with water. ? Avoid your genital area. ? Avoid any areas of skin that have broken skin, cuts, or scrapes. ? Scrub your back and under your arms. Make sure to wash skin folds. 4. Let the lather sit on your skin for 1-2 minutes or as long as told by your health care provider. 5. Using a different clean, wet washcloth, thoroughly rinse your entire body. Make sure that all body creases and crevices are rinsed well. 6. Dry off with a clean towel. Do not put any substances on your body afterward-such as powder, lotion, or perfume-unless you are told to do so by your health care provider. Only use lotions that are recommended by the manufacturer. 7. Put on clean clothes or pajamas. 8. If it is the night before your surgery, sleep in clean sheets. How to use CHG prepackaged cloths  Only use CHG cloths as told by your health care provider, and follow the instructions on the label.  Use the CHG cloth on clean, dry skin.  Do not use the CHG cloth on your head or face unless your health care provider tells you to.  When washing with the CHG cloth: ? Avoid your genital area. ? Avoid any areas of skin that have broken skin, cuts, or scrapes. Before surgery Follow these steps when using a CHG cloth to clean before surgery (unless your health care provider gives you different instructions): 1. Using the CHG cloth, vigorously scrub the part of your body where you will be having surgery. Scrub using a back-and-forth motion for 3 minutes. The area on your body should be completely wet with CHG when you are  done scrubbing. 2. Do not rinse. Discard the cloth and let the area air-dry. Do not put any substances on the area afterward, such as powder, lotion, or perfume. 3. Put on clean clothes or pajamas. 4. If it is the night before your surgery, sleep in clean sheets.  For general bathing Follow these steps when using CHG cloths for general bathing (unless your health care provider gives you different instructions). 1. Use a separate CHG cloth for each area of your body. Make sure you wash between any folds of skin and between your fingers and toes. Wash your body in the following order, switching to a new cloth after each step: ? The front of your neck, shoulders, and chest. ? Both of your arms, under your arms, and your hands. ? Your stomach and groin area, avoiding the genitals. ? Your right leg and foot. ? Your left leg and foot. ? The back of your neck, your back, and your buttocks. 2. Do not rinse. Discard the cloth and let the area air-dry. Do not put any substances on your body afterward-such as powder, lotion, or perfume-unless you are told to do so by your health care provider. Only use lotions that are recommended by the manufacturer. 3. Put on clean clothes or pajamas. Contact a health care provider if:  Your skin gets irritated after scrubbing.  You have questions about  using your solution or cloth. Get help right away if:  Your eyes become very red or swollen.  Your eyes itch badly.  Your skin itches badly and is red or swollen.  Your hearing changes.  You have trouble seeing.  You have swelling or tingling in your mouth or throat.  You have trouble breathing.  You swallow any chlorhexidine. Summary  Chlorhexidine gluconate (CHG) is a germ-killing (antiseptic) solution that is used to clean the skin. Cleaning your skin with CHG may help to lower your risk for infection.  You may be given CHG to use for bathing. It may be in a bottle or in a prepackaged cloth to use  on your skin. Carefully follow your health care provider's instructions and the instructions on the product label.  Do not use CHG if you have a chlorhexidine allergy.  Contact your health care provider if your skin gets irritated after scrubbing. This information is not intended to replace advice given to you by your health care provider. Make sure you discuss any questions you have with your health care provider. Document Released: 12/17/2011 Document Revised: 06/10/2018 Document Reviewed: 02/19/2017 Elsevier Patient Education  2020 [employer]

## 2019-01-17 ENCOUNTER — Encounter (HOSPITAL_COMMUNITY)
Admission: RE | Admit: 2019-01-17 | Discharge: 2019-01-17 | Disposition: A | Discharge status: Home / Self Care | Payer: Medicaid Other | Source: Ambulatory Visit | Attending: Podiatry | Admitting: Podiatry

## 2019-01-17 ENCOUNTER — Other Ambulatory Visit (HOSPITAL_COMMUNITY)
Admission: RE | Admit: 2019-01-17 | Discharge: 2019-01-17 | Disposition: A | Discharge status: Home / Self Care | Payer: Medicaid Other | Source: Ambulatory Visit | Attending: Podiatry | Admitting: Podiatry

## 2019-01-17 ENCOUNTER — Encounter (HOSPITAL_COMMUNITY): Payer: Self-pay

## 2019-01-17 ENCOUNTER — Ambulatory Visit (HOSPITAL_COMMUNITY)
Admission: RE | Admit: 2019-01-17 | Discharge: 2019-01-17 | Disposition: A | Discharge status: Home / Self Care | Payer: Medicaid Other | Source: Ambulatory Visit | Attending: Podiatry | Admitting: Podiatry

## 2019-01-17 ENCOUNTER — Other Ambulatory Visit: Payer: Self-pay

## 2019-01-17 DIAGNOSIS — Z01812 Encounter for preprocedural laboratory examination: Secondary | ICD-10-CM | POA: Insufficient documentation

## 2019-01-17 DIAGNOSIS — Z20828 Contact with and (suspected) exposure to other viral communicable diseases: Secondary | ICD-10-CM | POA: Insufficient documentation

## 2019-01-17 DIAGNOSIS — M79675 Pain in left toe(s): Secondary | ICD-10-CM | POA: Insufficient documentation

## 2019-01-17 HISTORY — DX: Other complications of anesthesia, initial encounter: T88.59XA

## 2019-01-17 LAB — SARS CORONAVIRUS 2 (TAT 6-24 HRS): SARS Coronavirus 2: NEGATIVE

## 2019-01-17 LAB — HCG, SERUM, QUALITATIVE: Preg, Serum: NEGATIVE

## 2019-01-19 ENCOUNTER — Ambulatory Visit (HOSPITAL_COMMUNITY): Payer: Medicaid Other | Admitting: Anesthesiology

## 2019-01-19 ENCOUNTER — Encounter (HOSPITAL_COMMUNITY)
Admission: RE | Disposition: A | Discharge status: Home / Self Care | Payer: Self-pay | Source: Home / Self Care | Attending: Podiatry

## 2019-01-19 ENCOUNTER — Ambulatory Visit (HOSPITAL_COMMUNITY)
Admission: RE | Admit: 2019-01-19 | Discharge: 2019-01-19 | Disposition: A | Discharge status: Home / Self Care | Payer: Medicaid Other | Attending: Podiatry | Admitting: Podiatry

## 2019-01-19 ENCOUNTER — Encounter (HOSPITAL_COMMUNITY): Payer: Self-pay | Admitting: Anesthesiology

## 2019-01-19 ENCOUNTER — Ambulatory Visit (HOSPITAL_COMMUNITY): Payer: Medicaid Other

## 2019-01-19 ENCOUNTER — Other Ambulatory Visit: Payer: Self-pay

## 2019-01-19 DIAGNOSIS — M2042 Other hammer toe(s) (acquired), left foot: Secondary | ICD-10-CM | POA: Diagnosis not present

## 2019-01-19 DIAGNOSIS — Z9889 Other specified postprocedural states: Secondary | ICD-10-CM

## 2019-01-19 DIAGNOSIS — M79675 Pain in left toe(s): Secondary | ICD-10-CM | POA: Diagnosis present

## 2019-01-19 HISTORY — PX: TOE ARTHROPLASTY: SHX6504

## 2019-01-19 SURGERY — ARTHROPLASTY, TOE
Anesthesia: Monitor Anesthesia Care | Site: Toe | Laterality: Left

## 2019-01-19 MED ORDER — BUPIVACAINE HCL (PF) 0.5 % IJ SOLN
INTRAMUSCULAR | Status: DC | PRN
  Administered 2019-01-19: 10 mL

## 2019-01-19 MED ORDER — GLYCOPYRROLATE PF 0.2 MG/ML IJ SOSY
PREFILLED_SYRINGE | INTRAMUSCULAR | Status: AC
  Filled 2019-01-19: qty 1

## 2019-01-19 MED ORDER — SODIUM CHLORIDE 0.9 % IR SOLN
Status: DC | PRN
  Administered 2019-01-19: 1000 mL

## 2019-01-19 MED ORDER — LIDOCAINE HCL (CARDIAC) PF 100 MG/5ML IV SOSY
PREFILLED_SYRINGE | INTRAVENOUS | Status: DC | PRN
  Administered 2019-01-19: 40 mg via INTRAVENOUS

## 2019-01-19 MED ORDER — PROMETHAZINE HCL 25 MG/ML IJ SOLN: 6.2500 mg | INTRAMUSCULAR | Status: DC | PRN

## 2019-01-19 MED ORDER — PROPOFOL 500 MG/50ML IV EMUL
INTRAVENOUS | Status: DC | PRN
  Administered 2019-01-19: 150 ug/kg/min via INTRAVENOUS

## 2019-01-19 MED ORDER — CHLORHEXIDINE GLUCONATE CLOTH 2 % EX PADS: 6.0000 | MEDICATED_PAD | Freq: Once | CUTANEOUS | Status: DC

## 2019-01-19 MED ORDER — LIDOCAINE HCL (PF) 1 % IJ SOLN
INTRAMUSCULAR | Status: AC
  Filled 2019-01-19: qty 30

## 2019-01-19 MED ORDER — FENTANYL CITRATE (PF) 100 MCG/2ML IJ SOLN
INTRAMUSCULAR | Status: AC
  Filled 2019-01-19: qty 2

## 2019-01-19 MED ORDER — MIDAZOLAM HCL 2 MG/2ML IJ SOLN
INTRAMUSCULAR | Status: AC
  Filled 2019-01-19: qty 2

## 2019-01-19 MED ORDER — ONDANSETRON HCL 4 MG/2ML IJ SOLN
INTRAMUSCULAR | Status: AC
  Filled 2019-01-19: qty 2

## 2019-01-19 MED ORDER — HYDROCODONE-ACETAMINOPHEN 7.5-325 MG PO TABS: 1.0000 | ORAL_TABLET | Freq: Once | ORAL | Status: DC | PRN

## 2019-01-19 MED ORDER — CEFAZOLIN SODIUM-DEXTROSE 2-4 GM/100ML-% IV SOLN
2.0000 g | INTRAVENOUS | Status: AC
  Administered 2019-01-19: 2 g via INTRAVENOUS

## 2019-01-19 MED ORDER — LACTATED RINGERS IV SOLN
INTRAVENOUS | Status: DC
  Administered 2019-01-19: 10:00:00 via INTRAVENOUS

## 2019-01-19 MED ORDER — LIDOCAINE 2% (20 MG/ML) 5 ML SYRINGE
INTRAMUSCULAR | Status: AC
  Filled 2019-01-19: qty 5

## 2019-01-19 MED ORDER — PROPOFOL 10 MG/ML IV BOLUS
INTRAVENOUS | Status: AC
  Filled 2019-01-19: qty 40

## 2019-01-19 MED ORDER — CEFAZOLIN SODIUM-DEXTROSE 2-4 GM/100ML-% IV SOLN
INTRAVENOUS | Status: AC
  Filled 2019-01-19: qty 100

## 2019-01-19 MED ORDER — DEXAMETHASONE SODIUM PHOSPHATE 10 MG/ML IJ SOLN
INTRAMUSCULAR | Status: AC
  Filled 2019-01-19: qty 1

## 2019-01-19 MED ORDER — MIDAZOLAM HCL 2 MG/2ML IJ SOLN: 0.5000 mg | Freq: Once | INTRAMUSCULAR | Status: DC | PRN

## 2019-01-19 MED ORDER — MIDAZOLAM HCL 5 MG/5ML IJ SOLN
INTRAMUSCULAR | Status: DC | PRN
  Administered 2019-01-19: 2 mg via INTRAVENOUS

## 2019-01-19 MED ORDER — HYDROMORPHONE HCL 1 MG/ML IJ SOLN: 0.2500 mg | INTRAMUSCULAR | Status: DC | PRN

## 2019-01-19 MED ORDER — PROPOFOL 10 MG/ML IV BOLUS
INTRAVENOUS | Status: DC | PRN
  Administered 2019-01-19: 20 mg via INTRAVENOUS

## 2019-01-19 MED ORDER — ONDANSETRON HCL 4 MG/2ML IJ SOLN
INTRAMUSCULAR | Status: DC | PRN
  Administered 2019-01-19: 4 mg via INTRAVENOUS

## 2019-01-19 MED ORDER — DEXAMETHASONE SODIUM PHOSPHATE 4 MG/ML IJ SOLN
INTRAMUSCULAR | Status: DC | PRN
  Administered 2019-01-19: 8 mg via INTRAVENOUS

## 2019-01-19 MED ORDER — LIDOCAINE HCL (PF) 1 % IJ SOLN
INTRAMUSCULAR | Status: DC | PRN
  Administered 2019-01-19: 10 mL

## 2019-01-19 MED ORDER — BUPIVACAINE HCL (PF) 0.5 % IJ SOLN
INTRAMUSCULAR | Status: AC
  Filled 2019-01-19: qty 30

## 2019-01-19 MED ORDER — FENTANYL CITRATE (PF) 100 MCG/2ML IJ SOLN
INTRAMUSCULAR | Status: DC | PRN
  Administered 2019-01-19: 50 ug via INTRAVENOUS
  Administered 2019-01-19 (×2): 25 ug via INTRAVENOUS

## 2019-01-19 SURGICAL SUPPLY — 45 items
BANDAGE ESMARK 4X12 BL STRL LF (DISPOSABLE) ×1 IMPLANT
BENZOIN TINCTURE PRP APPL 2/3 (GAUZE/BANDAGES/DRESSINGS) ×3 IMPLANT
BLADE OSC/SAG 11.5X5.5X.38 (BLADE) ×3 IMPLANT
BLADE OSC/SAGITTAL MD 5.5X18 (BLADE) ×3 IMPLANT
BLADE SURG 15 STRL LF DISP TIS (BLADE) ×2 IMPLANT
BLADE SURG 15 STRL SS (BLADE) ×4
BNDG CONFORM 2 STRL LF (GAUZE/BANDAGES/DRESSINGS) ×3 IMPLANT
BNDG ELASTIC 4X5.8 VLCR NS LF (GAUZE/BANDAGES/DRESSINGS) ×3 IMPLANT
BNDG ESMARK 4X12 BLUE STRL LF (DISPOSABLE) ×3
BNDG GAUZE ELAST 4 BULKY (GAUZE/BANDAGES/DRESSINGS) ×3 IMPLANT
BUR FAST CUTTING (BURR) ×2
BUR SRGRND 54.5X2.4X8 (BURR) ×1 IMPLANT
BURR SRGRND 54.5X2.4X8 (BURR) ×1
CHLORAPREP W/TINT 26 (MISCELLANEOUS) ×3 IMPLANT
CLOSURE WOUND 1/2 X4 (GAUZE/BANDAGES/DRESSINGS) ×1
CLOTH BEACON ORANGE TIMEOUT ST (SAFETY) ×3 IMPLANT
COVER LIGHT HANDLE STERIS (MISCELLANEOUS) ×6 IMPLANT
COVER WAND RF STERILE (DRAPES) ×3 IMPLANT
CUFF TOURN SGL QUICK 18X4 (TOURNIQUET CUFF) ×3 IMPLANT
DECANTER SPIKE VIAL GLASS SM (MISCELLANEOUS) ×6 IMPLANT
DRAPE OEC MINIVIEW 54X84 (DRAPES) ×3 IMPLANT
DRSG ADAPTIC 3X8 NADH LF (GAUZE/BANDAGES/DRESSINGS) ×3 IMPLANT
ELECT REM PT RETURN 9FT ADLT (ELECTROSURGICAL) ×3
ELECTRODE REM PT RTRN 9FT ADLT (ELECTROSURGICAL) ×1 IMPLANT
GAUZE SPONGE 4X4 12PLY STRL (GAUZE/BANDAGES/DRESSINGS) ×3 IMPLANT
GLOVE BIO SURGEON STRL SZ7.5 (GLOVE) ×3 IMPLANT
GLOVE BIOGEL PI IND STRL 7.5 (GLOVE) ×1 IMPLANT
GLOVE BIOGEL PI INDICATOR 7.5 (GLOVE) ×2
GLOVE ECLIPSE 7.0 STRL STRAW (GLOVE) ×3 IMPLANT
GOWN STRL REUS W/ TWL LRG LVL3 (GOWN DISPOSABLE) ×1 IMPLANT
GOWN STRL REUS W/TWL LRG LVL3 (GOWN DISPOSABLE) ×8 IMPLANT
KIT TURNOVER CYSTO (KITS) ×3 IMPLANT
MANIFOLD NEPTUNE II (INSTRUMENTS) ×3 IMPLANT
NEEDLE HYPO 25X1 1.5 SAFETY (NEEDLE) ×6 IMPLANT
NS IRRIG 1000ML POUR BTL (IV SOLUTION) ×3 IMPLANT
PACK BASIC LIMB (CUSTOM PROCEDURE TRAY) ×3 IMPLANT
PAD ARMBOARD 7.5X6 YLW CONV (MISCELLANEOUS) ×3 IMPLANT
PADDING WEBRIL 4 STERILE (GAUZE/BANDAGES/DRESSINGS) ×3 IMPLANT
RASP SM TEAR CROSS CUT (RASP) ×3 IMPLANT
SET BASIN LINEN APH (SET/KITS/TRAYS/PACK) ×3 IMPLANT
STRIP CLOSURE SKIN 1/2X4 (GAUZE/BANDAGES/DRESSINGS) ×2 IMPLANT
SUT PROLENE 4 0 PS 2 18 (SUTURE) ×6 IMPLANT
SUT VIC AB 2-0 CT2 27 (SUTURE) ×3 IMPLANT
SUT VICRYL AB 3-0 FS1 BRD 27IN (SUTURE) ×3 IMPLANT
SYR CONTROL 10ML LL (SYRINGE) ×6 IMPLANT

## 2019-01-19 NOTE — Brief Op Note (Signed)
01/19/2019  12:23 PM  PATIENT:  Jennifer Fox  28 y.o. female  PRE-OPERATIVE DIAGNOSIS:  toe pain, 2nd, 3rd and 4th hammer toes left foot  POST-OPERATIVE DIAGNOSIS:  toe pain, 2nd, 3rd and 4th hammer toes left foot  PROCEDURE:  Procedure(s): TOE ARTHROPLASTY LEFT 2ND, 3RD AND 4TH TOE (Left)  SURGEON:  Surgeon(s) and Role:    * Tru Rana, Layla Barter, DPM - Primary   ASSISTANTS: none   ANESTHESIA:   local and MAC  EBL: none  BLOOD ADMINISTERED:none  DRAINS: none   LOCAL MEDICATIONS USED:  LIDOCAINE  and Amount: 10 ml pre op. Marcaine 88m post op.   SPECIMEN:  No Specimen  DISPOSITION OF SPECIMEN:  N/A  COUNTS:  YES  TOURNIQUET:   Total Tourniquet Time Documented: Calf (Left) - 51 minutes Total: Calf (Left) - 51 minutes   DICTATION: .DViviann SpareDictation  PLAN OF CARE: Discharge to home after PACU  PATIENT DISPOSITION:  PACU - hemodynamically stable.   Delay start of Pharmacological VTE agent (>24hrs) due to surgical blood loss or risk of bleeding: not applicable

## 2019-01-19 NOTE — H&P (Signed)
.   HISTORY AND PHYSICAL INTERVAL NOTE:  01/19/2019  10:49 AM  Jennifer Fox  has presented today for surgery, with the diagnosis of left toes pain, 2nd, 3rd and 4th hammer toes.  The various methods of treatment have been discussed with the patient.  No guarantees were given.  After consideration of risks, benefits and other options for treatment, the patient has consented to surgery.  I have reviewed the patients' chart and labs.    Patient Vitals for the past 24 hrs:  BP Temp Temp src Pulse Resp SpO2  01/19/19 0952 105/60 98.3 F (36.8 C) Oral (!) 55 16 100 %    A history and physical examination was performed in my office.  The patient was reexamined.  There have been no changes to this history and physical examination.   Mekiah Cambridge, DPM

## 2019-01-19 NOTE — Discharge Instructions (Signed)
.These instructions will give you an idea of what to expect after surgery and how to manage issues that may arise before your first post op office visit.  Pain Management Pain is best managed by staying ahead of it. If pain gets out of control, it is difficult to get it back under control. Local anesthesia that lasts 6-8 hours is used to numb the foot and decrease pain.  For the best pain control, take the pain medication every 4 hours for the first 2 days post op. On the third day pain medication can be taken as needed.   Post Op Nausea Nausea is common after surgery, so it is managed proactively.  If prescribed, use the prescribed nausea medication regularly for the first 2 days post op.  Bandages Do not worry if there is blood on the bandage. What looks like a lot of blood on the bandage is actually a small amount. Blood on the dressing spreads out as it is absorbed by the gauze, the same way a drop of water spreads out on a paper towel.  If the bandages feel wet or dry, stiff and uncomfortable, call the office during office hours and we will schedule a time for you to have the bandage changed.  Unless you are specifically told otherwise, we will do the first bandage change in the office.  Keep your bandage dry. If the bandage becomes wet or soiled, notify the office and we will schedule a time to change the bandage.  Activity It is best to spend most of the first 2 days after surgery lying down with the foot elevated above the level of your heart. You may put weight on your heel while wearing the surgical shoe.   You may only get up to go to the restroom.  Driving Do not drive until you are able to respond in an emergency (i.e. slam on the brakes). This usually occurs after the bone has healed - 6 to 8 weeks.  Call the Office If you have a fever over 101F.  If you have increasing pain after the initial post op pain has settled down.  If you have increasing redness, swelling, or  drainage.  If you have any questions or concerns.    General Anesthesia, Adult, Care After This sheet gives you information about how to care for yourself after your procedure. Your health care provider may also give you more specific instructions. If you have problems or questions, contact your health care provider. What can I expect after the procedure? After the procedure, the following side effects are common:  Pain or discomfort at the IV site.  Nausea.  Vomiting.  Sore throat.  Trouble concentrating.  Feeling cold or chills.  Weak or tired.  Sleepiness and fatigue.  Soreness and body aches. These side effects can affect parts of the body that were not involved in surgery. Follow these instructions at home:  For at least 24 hours after the procedure:  Have a responsible adult stay with you. It is important to have someone help care for you until you are awake and alert.  Rest as needed.  Do not: ? Participate in activities in which you could fall or become injured. ? Drive. ? Use heavy machinery. ? Drink alcohol. ? Take sleeping pills or medicines that cause drowsiness. ? Make important decisions or sign legal documents. ? Take care of children on your own. Eating and drinking  Follow any instructions from your health care provider about eating  or drinking restrictions.  When you feel hungry, start by eating small amounts of foods that are soft and easy to digest (bland), such as toast. Gradually return to your regular diet.  Drink enough fluid to keep your urine pale yellow.  If you vomit, rehydrate by drinking water, juice, or clear broth. General instructions  If you have sleep apnea, surgery and certain medicines can increase your risk for breathing problems. Follow instructions from your health care provider about wearing your sleep device: ? Anytime you are sleeping, including during daytime naps. ? While taking prescription pain medicines, sleeping  medicines, or medicines that make you drowsy.  Return to your normal activities as told by your health care provider. Ask your health care provider what activities are safe for you.  Take over-the-counter and prescription medicines only as told by your health care provider.  If you smoke, do not smoke without supervision.  Keep all follow-up visits as told by your health care provider. This is important. Contact a health care provider if:  You have nausea or vomiting that does not get better with medicine.  You cannot eat or drink without vomiting.  You have pain that does not get better with medicine.  You are unable to pass urine.  You develop a skin rash.  You have a fever.  You have redness around your IV site that gets worse. Get help right away if:  You have difficulty breathing.  You have chest pain.  You have blood in your urine or stool, or you vomit blood. Summary  After the procedure, it is common to have a sore throat or nausea. It is also common to feel tired.  Have a responsible adult stay with you for the first 24 hours after general anesthesia. It is important to have someone help care for you until you are awake and alert.  When you feel hungry, start by eating small amounts of foods that are soft and easy to digest (bland), such as toast. Gradually return to your regular diet.  Drink enough fluid to keep your urine pale yellow.  Return to your normal activities as told by your health care provider. Ask your health care provider what activities are safe for you. This information is not intended to replace advice given to you by your health care provider. Make sure you discuss any questions you have with your health care provider. Document Released: 06/30/2000 Document Revised: 03/27/2017 Document Reviewed: 11/07/2016 Elsevier Patient Education  2020 [employer]  Wound Care, Adult Taking care of your wound properly can help to prevent pain, infection,  and scarring. It can also help your wound to heal more quickly. How to care for your wound Wound care      Follow instructions from your health care provider about how to take care of your wound. Make sure you: ? Wash your hands with soap and water before you change the bandage (dressing). If soap and water are not available, use hand sanitizer. ? Change your dressing as told by your health care provider. ? Leave stitches (sutures), skin glue, or adhesive strips in place. These skin closures may need to stay in place for 2 weeks or longer. If adhesive strip edges start to loosen and curl up, you may trim the loose edges. Do not remove adhesive strips completely unless your health care provider tells you to do that.  Check your wound area every day for signs of infection. Check for: ? Redness, swelling, or pain. ? Fluid  or blood. ? Warmth. ? Pus or a bad smell.  Ask your health care provider if you should clean the wound with mild soap and water. Doing this may include: ? Using a clean towel to pat the wound dry after cleaning it. Do not rub or scrub the wound. ? Applying a cream or ointment. Do this only as told by your health care provider. ? Covering the incision with a clean dressing.  Ask your health care provider when you can leave the wound uncovered.  Keep the dressing dry until your health care provider says it can be removed. Do not take baths, swim, use a hot tub, or do anything that would put the wound underwater until your health care provider approves. Ask your health care provider if you can take showers. You may only be allowed to take sponge baths. Medicines   If you were prescribed an antibiotic medicine, cream, or ointment, take or use the antibiotic as told by your health care provider. Do not stop taking or using the antibiotic even if your condition improves.  Take over-the-counter and prescription medicines only as told by your health care provider. If you were  prescribed pain medicine, take it 30 or more minutes before you do any wound care or as told by your health care provider. General instructions  Return to your normal activities as told by your health care provider. Ask your health care provider what activities are safe.  Do not scratch or pick at the wound.  Do not use any products that contain nicotine or tobacco, such as cigarettes and e-cigarettes. These may delay wound healing. If you need help quitting, ask your health care provider.  Keep all follow-up visits as told by your health care provider. This is important.  Eat a diet that includes protein, vitamin A, vitamin C, and other nutrient-rich foods to help the wound heal. ? Foods rich in protein include meat, dairy, beans, nuts, and other sources. ? Foods rich in vitamin A include carrots and dark green, leafy vegetables. ? Foods rich in vitamin C include citrus, tomatoes, and other fruits and vegetables. ? Nutrient-rich foods have protein, carbohydrates, fat, vitamins, or minerals. Eat a variety of healthy foods including vegetables, fruits, and whole grains. Contact a health care provider if:  You received a tetanus shot and you have swelling, severe pain, redness, or bleeding at the injection site.  Your pain is not controlled with medicine.  You have redness, swelling, or pain around the wound.  You have fluid or blood coming from the wound.  Your wound feels warm to the touch.  You have pus or a bad smell coming from the wound.  You have a fever or chills.  You are nauseous or you vomit.  You are dizzy. Get help right away if:  You have a red streak going away from your wound.  The edges of the wound open up and separate.  Your wound is bleeding, and the bleeding does not stop with gentle pressure.  You have a rash.  You faint.  You have trouble breathing. Summary  Always wash your hands with soap and water before changing your bandage (dressing).  To  help with healing, eat foods that are rich in protein, vitamin A, vitamin C, and other nutrients.  Check your wound every day for signs of infection. Contact your health care provider if you suspect that your wound is infected. This information is not intended to replace advice given to you by  your health care provider. Make sure you discuss any questions you have with your health care provider. Document Released: 01/01/2008 Document Revised: 07/12/2018 Document Reviewed: 10/09/2015 Elsevier Patient Education  2020 [employer].

## 2019-01-19 NOTE — Op Note (Signed)
01/19/2019  12:23 PM  PATIENT:  Jennifer Fox  28 y.o. 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 Marcaine 44m post op.   SPECIMEN:  No Specimen  TOURNIQUET:   Total Tourniquet Time Documented: Calf (Left) - 51 minutes Total: Calf (Left) - 51 minutes   Patient was brought into the operating room laid supine on the operating table. Ankle tourniquet was applied to the surgical extremity. Following IV sedation, a local block was achieved using 10 cc of 1% plain lidocaine. The foot was the prepped, scrubbed and draped in aseptic manner. Using an esmarch band the tourniquet on the surgical site was inflatted at 2552mG.   Attention was directed over the Left second distal interphalangeal joint. A semi elliptical incision was marked over the 3rd toe at DIPJ. Using #15 blade skin incision was made. The incision was then deepened to the level of the extensor tendon. Care was made to retract all neurovascular bundle. At this time transverse tenotomy of the extenson tendon was performed at the middle phalanx head. The tendon was reflected back.  At this time the head of the middle phalanx was freed and using a saw, the head of middle phalanx was removed. Bone rongeur was used to remove any sharp edges.    Attention was directed over the Left third proximal interphalangeal joint. A semi elliptical incision was marked over the 3rd toe at PIPJ. Using #15 blade skin incision was made. The incision was then deepened to the level of the extensor tendon. Care was made to retract all  neurovascular bundle. At this time transverse tenotomy of the extenson tendon was performed at the proximal phalanx head. The tendon was reflected back.  At this time the head of the proximal phalanx was freed and using a saw, the head was removed. Bone rongeur was used to remove any sharp edges.    Attention was directed over the Left fourth proximal interphalangeal joint. A semi elliptical incision was marked over the 3rd toe at PIPJ from distal medial to proximal lateral. Using #15 blade skin incision was made. The incision was then deepened to the level of the extensor tendon. Care was made to retract all neurovascular bundle. At this time transverse tenotomy of the extenson tendon was performed at the proximal phalanx head. The tendon was reflected back.  At this time the head of the proximal phalanx was freed and using a saw, the head was removed. Bone rongeur was used to remove any sharp edges.    Fluoroscopy was used to visualized all the cuts. All the wounds were flushed. The tendon was repair back using 3-0 Vicryl. The skin was closed using 4-0 prolene. Dry sterile dressing was applied. The tourniquet was deflated.Capillary refill time was immediate to all the toes. Patient will be place in surgical shoe with partial weightbearing as tolerated.

## 2019-01-19 NOTE — Anesthesia Preprocedure Evaluation (Signed)
Anesthesia Evaluation  Patient identified by MRN, date of birth, ID band Patient awake  General Assessment Comment:Denied personal or FH of anesthesia issues   Reviewed: Allergy & Precautions, NPO status , Patient's Chart, lab work & pertinent test results  History of Anesthesia Complications Negative for: history of anesthetic complications  Airway Mallampati: I  TM Distance: >3 FB Neck ROM: Full    Dental no notable dental hx. (+) Teeth Intact   Pulmonary neg pulmonary ROS,    Pulmonary exam normal breath sounds clear to auscultation       Cardiovascular Exercise Tolerance: Good negative cardio ROS Normal cardiovascular examI Rhythm:Regular Rate:Normal     Neuro/Psych negative neurological ROS  negative psych ROS   GI/Hepatic negative GI ROS, Neg liver ROS,   Endo/Other  negative endocrine ROS  Renal/GU negative Renal ROS  negative genitourinary   Musculoskeletal negative musculoskeletal ROS (+)   Abdominal   Peds negative pediatric ROS (+)  Hematology negative hematology ROS (+)   Anesthesia Other Findings   Reproductive/Obstetrics negative OB ROS                             Anesthesia Physical Anesthesia Plan  ASA: I  Anesthesia Plan: MAC   Post-op Pain Management:    Induction: Intravenous  PONV Risk Score and Plan: 2 and TIVA, Midazolam, Propofol infusion, Ondansetron and Treatment may vary due to age or medical condition  Airway Management Planned: Nasal Cannula and Simple Face Mask  Additional Equipment:   Intra-op Plan:   Post-operative Plan:   Informed Consent: I have reviewed the patients History and Physical, chart, labs and discussed the procedure including the risks, benefits and alternatives for the proposed anesthesia with the patient or authorized representative who has indicated his/her understanding and acceptance.     Dental advisory given  Plan  Discussed with: CRNA  Anesthesia Plan Comments: (Plan Full PPE use Plan MAC  D/W PT -WTP with same after Q&A  Also d/w pt possible GA vs GETA as needed -voiced understanding WTP with MAC as tolerated )        Anesthesia Quick Evaluation

## 2019-01-19 NOTE — Transfer of Care (Signed)
Immediate Anesthesia Transfer of Care Note  Patient: Jennifer Fox  Procedure(s) Performed: TOE ARTHROPLASTY LEFT 2ND, 3RD AND 4TH TOE (Left Toe)  Patient Location: PACU  Anesthesia Type:MAC  Level of Consciousness: awake, oriented and patient cooperative  Airway & Oxygen Therapy: Patient Spontanous Breathing  Post-op Assessment: Report given to RN and Post -op Vital signs reviewed and stable  Post vital signs: Reviewed and stable  Last Vitals:  Vitals Value Taken Time  BP 103/64 01/19/19 1226  Temp    Pulse 50 01/19/19 1228  Resp 15 01/19/19 1228  SpO2 100 % 01/19/19 1228  Vitals shown include unvalidated device data.  Last Pain:  Vitals:   01/19/19 0952  TempSrc: Oral  PainSc: 0-No pain         Complications: No apparent anesthesia complications

## 2019-01-19 NOTE — Anesthesia Procedure Notes (Signed)
Procedure Name: MAC Date/Time: 01/19/2019 11:13 AM Performed by: Andree Elk Jaida Basurto A, CRNA Pre-anesthesia Checklist: Patient identified, Emergency Drugs available, Suction available, Timeout performed and Patient being monitored Patient Re-evaluated:Patient Re-evaluated prior to induction Oxygen Delivery Method: Nasal Cannula

## 2019-01-19 NOTE — Anesthesia Postprocedure Evaluation (Signed)
Anesthesia Post Note  Patient: Jennifer Fox  Procedure(s) Performed: TOE ARTHROPLASTY LEFT 2ND, 3RD AND 4TH TOE (Left Toe)  Patient location during evaluation: PACU Anesthesia Type: MAC Level of consciousness: awake, oriented and patient cooperative Pain management: pain level controlled Vital Signs Assessment: post-procedure vital signs reviewed and stable Respiratory status: spontaneous breathing Cardiovascular status: stable Postop Assessment: no apparent nausea or vomiting Anesthetic complications: no     Last Vitals:  Vitals:   01/19/19 0952 01/19/19 1226  BP: 105/60 (P) 103/64  Pulse: (!) 55   Resp: 16   Temp: 36.8 C (P) 36.4 C  SpO2: 100%     Last Pain:  Vitals:   01/19/19 0952  TempSrc: Oral  PainSc: 0-No pain                 ADAMS, AMY A

## 2019-01-20 ENCOUNTER — Encounter (HOSPITAL_COMMUNITY): Payer: Self-pay | Admitting: Podiatry

## 2021-05-11 IMAGING — CR DG FOOT COMPLETE 3+V*L*
3 series · 3 of 3 positions shown · non-contrast
Comparison: January 17, 2019.

CLINICAL DATA: Postoperative change in left foot.

EXAM:
LEFT FOOT - COMPLETE 3+ VIEW

[ap]
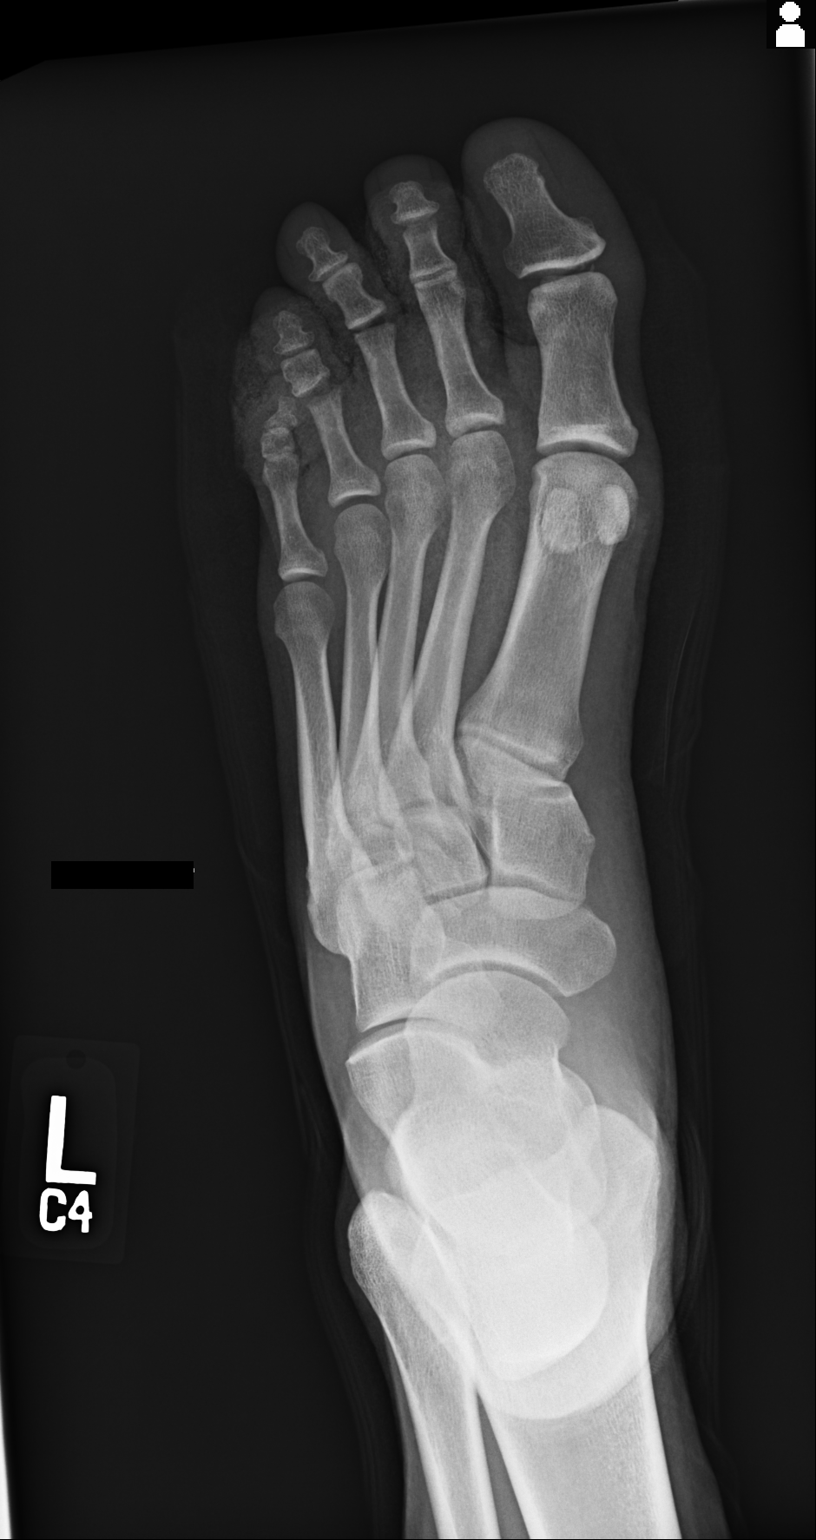

[oblique]
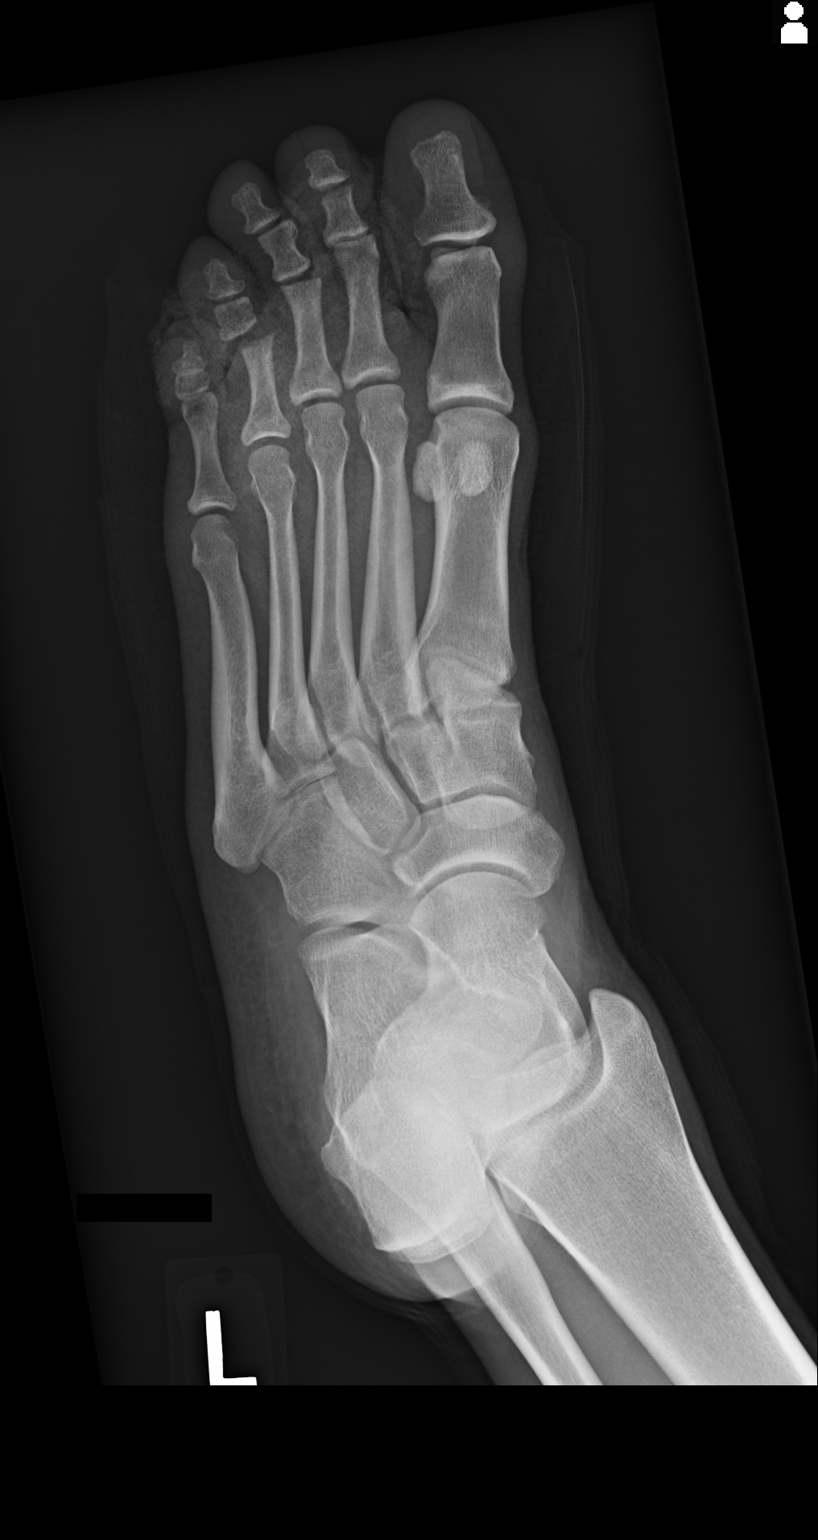

[lat]
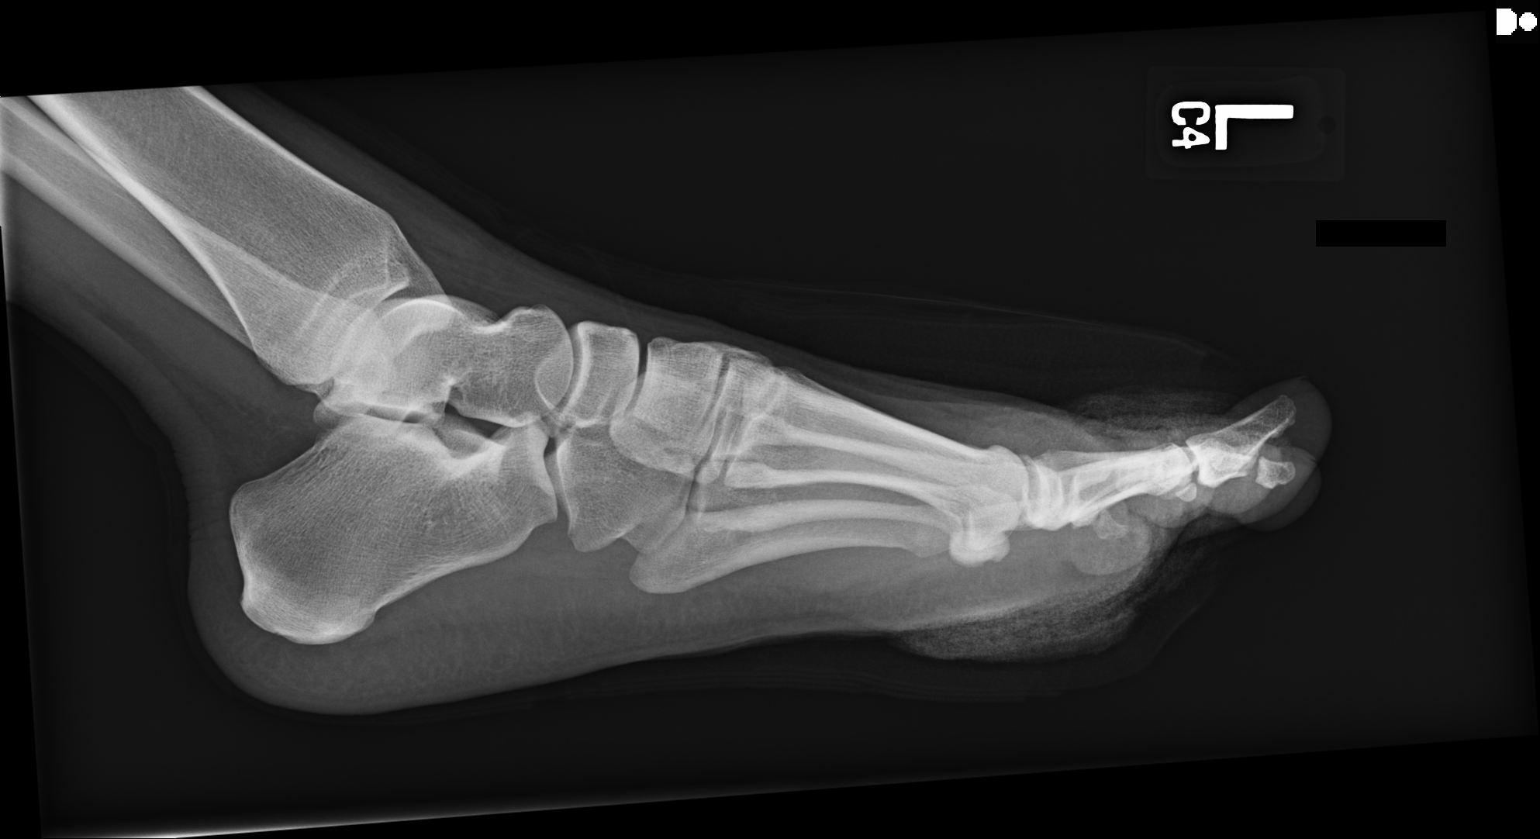

[3 of 3 positions shown; findings below may reference images not displayed]

FINDINGS: Patient appears to be status post osteotomy involving the distal
portions of the third and fourth proximal phalanges. No radiopaque
foreign body is noted. No other bony abnormality is noted. Remaining
joint spaces are unremarkable.
IMPRESSION: Status post osteotomy involving the distal portions of the third and
fourth proximal phalanges.
